# Patient Record
Sex: Female | Born: 1990
Health system: Southern US, Community
[De-identification: ages and names within clinical notes are randomized; demographics above are authoritative.]

## PROBLEM LIST (undated history)

## (undated) ENCOUNTER — Ambulatory Visit: Discharge status: Absent without leave | Payer: Self-pay

## (undated) DIAGNOSIS — J45909 Unspecified asthma, uncomplicated: Secondary | ICD-10-CM

---

## 2013-06-12 ENCOUNTER — Encounter (HOSPITAL_COMMUNITY): Payer: Self-pay | Admitting: Emergency Medicine

## 2013-06-12 ENCOUNTER — Emergency Department (HOSPITAL_COMMUNITY)
Admission: EM | Admit: 2013-06-12 | Discharge: 2013-06-12 | Disposition: A | Discharge status: Home / Self Care | Payer: BC Managed Care – PPO | Source: Home / Self Care

## 2013-06-12 DIAGNOSIS — B9789 Other viral agents as the cause of diseases classified elsewhere: Secondary | ICD-10-CM

## 2013-06-12 DIAGNOSIS — B349 Viral infection, unspecified: Secondary | ICD-10-CM

## 2013-06-12 HISTORY — DX: Unspecified asthma, uncomplicated: J45.909

## 2013-06-12 MED ORDER — IBUPROFEN 800 MG PO TABS: 800.0000 mg | ORAL_TABLET | Freq: Three times a day (TID) | ORAL | Status: DC

## 2013-06-12 NOTE — ED Notes (Signed)
Initially had sore throat, but does not complain of sore throat now.  Reports head congestion, denies ear pain, unsure if she has run a fever, patient has had chills.

## 2013-06-12 NOTE — ED Provider Notes (Signed)
CSN: [id number]     Arrival date & time 06/12/13  1141 History   None    Chief Complaint  Patient presents with  . URI   (Consider location/radiation/quality/duration/timing/severity/associated sxs/prior Treatment) Patient is a 23 y.o. female presenting with URI. The history is provided by the patient. No language interpreter was used.  URI Presenting symptoms: congestion and sore throat   Severity:  Moderate Onset quality:  Gradual Duration:  5 days Timing:  Constant Progression:  Worsening Chronicity:  New Relieved by:  Nothing Worsened by:  Nothing tried Associated symptoms: headaches     Past Medical History  Diagnosis Date  . Asthma    History reviewed. No pertinent past surgical history. No family history on file. History  Substance Use Topics  . Smoking status: Never Smoker   . Smokeless tobacco: Not on file  . Alcohol Use: No   OB History   Grav Para Term Preterm Abortions TAB SAB Ect Mult Living                 Review of Systems  HENT: Positive for congestion and sore throat.   Neurological: Positive for headaches.  All other systems reviewed and are negative.    Allergies  Review of patient's allergies indicates no known allergies.  Home Medications  No current outpatient prescriptions on file. BP 122/77  Pulse 74  Temp(Src) 98.4 F (36.9 C) (Oral)  Resp 16  SpO2 100%  LMP 06/04/2013 Physical Exam  Constitutional: She appears well-developed and well-nourished.  HENT:  Right Ear: External ear normal.  Left Ear: External ear normal.  Nose: Nose normal.  Mouth/Throat: Oropharynx is clear and moist.  Eyes: Conjunctivae and EOM are normal. Pupils are equal, round, and reactive to light.  Neck: Normal range of motion. Neck supple.  Cardiovascular: Normal rate and normal heart sounds.   Pulmonary/Chest: Effort normal and breath sounds normal.  Abdominal: Soft.  Musculoskeletal: Normal range of motion.  Neurological: She is alert.  Skin: Skin  is warm.  Psychiatric: She has a normal mood and affect.    ED Course  Procedures (including critical care time) Labs Review Labs Reviewed - No data to display Imaging Review No results found.  EKG Interpretation    Date/Time:    Ventricular Rate:    PR Interval:    QRS Duration:   QT Interval:    QTC Calculation:   R Axis:     Text Interpretation:              MDM   1. Viral illness    Ibuprofen     Elson AreasLeslie K Sofia, PA-C 06/12/13 1412

## 2013-06-12 NOTE — Discharge Instructions (Signed)
Viral Infections °A virus is a type of germ. Viruses can cause: °· Minor sore throats. °· Aches and pains. °· Headaches. °· Runny nose. °· Rashes. °· Watery eyes. °· Tiredness. °· Coughs. °· Loss of appetite. °· Feeling sick to your stomach (nausea). °· Throwing up (vomiting). °· Watery poop (diarrhea). °HOME CARE  °· Only take medicines as told by your doctor. °· Drink enough water and fluids to keep your pee (urine) clear or pale yellow. Sports drinks are a good choice. °· Get plenty of rest and eat healthy. Soups and broths with crackers or rice are fine. °GET HELP RIGHT AWAY IF:  °· You have a very bad headache. °· You have shortness of breath. °· You have chest pain or neck pain. °· You have an unusual rash. °· You cannot stop throwing up. °· You have watery poop that does not stop. °· You cannot keep fluids down. °· You or your child has a temperature by mouth above 102° F (38.9° C), not controlled by medicine. °· Your baby is older than 3 months with a rectal temperature of 102° F (38.9° C) or higher. °· Your baby is 3 months old or younger with a rectal temperature of 100.4° F (38° C) or higher. °MAKE SURE YOU:  °· Understand these instructions. °· Will watch this condition. °· Will get help right away if you are not doing well or get worse. °Document Released: 05/01/2008 Document Revised: 08/11/2011 Document Reviewed: 09/24/2010 °ExitCare® Patient Information ©2014 [employer]. ° °

## 2013-06-13 NOTE — ED Provider Notes (Signed)
Medical screening examination/treatment/procedure(s) were performed by resident physician or non-physician practitioner and as supervising physician I was immediately available for consultation/collaboration.   KINDL,JAMES DOUGLAS MD.   James D Kindl, MD 06/13/13 1755 

## 2013-08-22 ENCOUNTER — Encounter (HOSPITAL_COMMUNITY): Payer: Self-pay | Admitting: Emergency Medicine

## 2013-08-22 ENCOUNTER — Emergency Department (HOSPITAL_COMMUNITY)
Admission: EM | Admit: 2013-08-22 | Discharge: 2013-08-22 | Disposition: A | Discharge status: Home / Self Care | Payer: BC Managed Care – PPO | Source: Home / Self Care | Attending: Emergency Medicine | Admitting: Emergency Medicine

## 2013-08-22 ENCOUNTER — Emergency Department (INDEPENDENT_AMBULATORY_CARE_PROVIDER_SITE_OTHER): Payer: BC Managed Care – PPO

## 2013-08-22 DIAGNOSIS — S61209A Unspecified open wound of unspecified finger without damage to nail, initial encounter: Secondary | ICD-10-CM

## 2013-08-22 DIAGNOSIS — W268XXA Contact with other sharp object(s), not elsewhere classified, initial encounter: Secondary | ICD-10-CM

## 2013-08-22 DIAGNOSIS — S61012A Laceration without foreign body of left thumb without damage to nail, initial encounter: Secondary | ICD-10-CM

## 2013-08-22 DIAGNOSIS — S60459A Superficial foreign body of unspecified finger, initial encounter: Secondary | ICD-10-CM

## 2013-08-22 DIAGNOSIS — Z23 Encounter for immunization: Secondary | ICD-10-CM

## 2013-08-22 DIAGNOSIS — S60352A Superficial foreign body of left thumb, initial encounter: Secondary | ICD-10-CM

## 2013-08-22 MED ORDER — TETANUS-DIPHTH-ACELL PERTUSSIS 5-2.5-18.5 LF-MCG/0.5 IM SUSP
INTRAMUSCULAR | Status: AC
  Filled 2013-08-22: qty 0.5

## 2013-08-22 MED ORDER — TETANUS-DIPHTH-ACELL PERTUSSIS 5-2.5-18.5 LF-MCG/0.5 IM SUSP
0.5000 mL | Freq: Once | INTRAMUSCULAR | Status: AC
  Administered 2013-08-22: 0.5 mL via INTRAMUSCULAR

## 2013-08-22 MED ORDER — HYDROCODONE-ACETAMINOPHEN 5-325 MG PO TABS: ORAL_TABLET | ORAL | Status: DC

## 2013-08-22 NOTE — ED Provider Notes (Signed)
Chief Complaint   Chief Complaint  Patient presents with  . 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 The pipette broke. She has a small flap laceration the tip of the thumb. She's not sure whether any glass is present or not. Bleeding is controlled. She cannot recall when her last tetanus shot was.  Review of Systems   Other than as noted above, the patient denies any of the following symptoms: Musculoskeletal:  No joint pain or decreased range of motion. Neuro:  No numbness, tingling, or weakness.  PMFSH   Past medical history, family history, social history, meds, and allergies were reviewed.   Physical Examination     Vital signs:  BP 144/90  Pulse 82  Temp(Src) 98.5 F (36.9 C) (Oral)  Resp 16  SpO2 100%  LMP 08/20/2013 Ext:  There is a 5 mm flap laceration the tip of the thumb. Bleeding is well controlled.  All other joints had a full ROM without pain.  Pulses were full.  Good capillary refill in all digits.  No edema. Neurological:  Alert and oriented.  No muscle weakness.  Sensation was intact to light touch.   Radiology   Dg Finger Thumb Left  08/22/2013   CLINICAL DATA:  Glass injury to thumb.  EXAM: LEFT THUMB 2+V  COMPARISON:  None.  FINDINGS: Two linear foreign bodies within the soft tissues of the tip of the thumb identified.  No fracture, subluxation or dislocation.  IMPRESSION: Two small linear foreign bodies within the tip of the thumb.   Electronically Signed   By: Laveda Abbe M.D.   On: 08/22/2013 20:04     I reviewed the images independently and personally and concur with the radiologist's findings.  Procedure   Verbal informed consent was obtained.  The patient was informed of the risks and benefits of the procedure and understands and accepts.  A time out was called and the identity of the  patient and correct procedure were confirmed.   The laceration area described above was prepped with Betadine and saline  and anesthetized with a digital block with 5 mL of 2% Xylocaine without epinephrine.  The flap laceration was opened up and the 2 glass shards that were seen on x-ray were found after exploration and removed. There was considerable bleeding after this, so a suture closure of the wound was done. The wound was then closed as follows:  The edges were approximated with 2 5-0 nylon sutures. One suture was placed through the fingernail.  There were no immediate complications, and the patient tolerated the procedure well. The laceration was then cleansed, Bacitracin ointment was applied and a clean, dry pressure dressing was put on.   Course in Urgent [facility] Center   Given a Tdap vaccine.  Assessment   The primary encounter diagnosis was Laceration of left thumb. A diagnosis of Foreign body of thumb, left was also pertinent to this visit.  Plan   1.  Meds:  The following meds were prescribed:   Discharge Medication List as of 08/22/2013  8:55 PM    START taking these medications   Details  HYDROcodone-acetaminophen (NORCO/VICODIN) 5-325 MG per tablet 1 to 2 tabs every 4 to 6 hours as needed for pain., Print        2.  Patient Education/Counseling:  The  patient was given appropriate handouts, self care instructions, and instructed in symptomatic relief. Instructions were given for wound care.    3.  Follow up:  The patient was told to follow up immediately if there is any sign of infection.The patient will return in 14 days for suture removal.      Reuben Likesavid C Corleone Biegler, MD 08/22/13 2221

## 2013-08-22 NOTE — Discharge Instructions (Signed)

## 2013-08-22 NOTE — ED Notes (Signed)
Pipette broke in chemistry lab and cut her L thumb @ 1615.  Bleeding is stopped.  Good sensation and movement of thumb.  Cut is on the tip of the finger.

## 2013-09-06 ENCOUNTER — Emergency Department (INDEPENDENT_AMBULATORY_CARE_PROVIDER_SITE_OTHER)
Admission: EM | Admit: 2013-09-06 | Discharge: 2013-09-06 | Disposition: A | Discharge status: Home / Self Care | Payer: BC Managed Care – PPO | Source: Home / Self Care | Attending: Emergency Medicine | Admitting: Emergency Medicine

## 2013-09-06 ENCOUNTER — Encounter (HOSPITAL_COMMUNITY): Payer: Self-pay | Admitting: Emergency Medicine

## 2013-09-06 DIAGNOSIS — Z4802 Encounter for removal of sutures: Secondary | ICD-10-CM

## 2013-09-06 NOTE — ED Notes (Signed)
Suture removal

## 2013-09-06 NOTE — Discharge Instructions (Signed)
Suture Removal, Care After Refer to this sheet in the next few weeks. These instructions provide you with information on caring for yourself after your procedure. Your health care provider may also give you more specific instructions. Your treatment has been planned according to current medical practices, but problems sometimes occur. Call your health care provider if you have any problems or questions after your procedure. WHAT TO EXPECT AFTER THE PROCEDURE After your stitches (sutures) are removed, it is typical to have the following:  Some discomfort and swelling in the wound area.  Slight redness in the area. HOME CARE INSTRUCTIONS   If you have skin adhesive strips over the wound area, do not take the strips off. They will fall off on their own in a few days. If the strips remain in place after 14 days, you may remove them.  Change any bandages (dressings) at least once a day or as directed by your health care provider. If the bandage sticks, soak it off with warm, soapy water.  Apply cream or ointment only as directed by your health care provider. If using cream or ointment, wash the area with soap and water 2 times a day to remove all the cream or ointment. Rinse off the soap and pat the area dry with a clean towel.  Keep the wound area dry and clean. If the bandage becomes wet or dirty, or if it develops a bad smell, change it as soon as possible.  Continue to protect the wound from injury.  Use sunscreen when out in the sun. New scars become sunburned easily. SEEK MEDICAL CARE IF:  You have increasing redness, swelling, or pain in the wound.  You see pus coming from the wound.  You have a fever.  You notice a bad smell coming from the wound or dressing.  Your wound breaks open (edges not staying together). Document Released: 02/11/2001 Document Revised: 03/09/2013 Document Reviewed: 12/29/2012 ExitCare Patient Information 2014 [employer].  

## 2013-09-06 NOTE — ED Provider Notes (Signed)
CSN: [id number]     Arrival date & time 09/06/13  1759 History   First MD Initiated Contact with Patient 09/06/13 1923     Chief Complaint  Patient presents with  . Suture / Staple Removal   (Consider location/radiation/quality/duration/timing/severity/associated sxs/prior Treatment) Patient is a 23 y.o. female presenting with suture removal. The history is provided by the patient. No language interpreter was used.  Suture / Staple Removal This is a new problem. The current episode started more than 1 week ago. The problem occurs constantly. The problem has not changed since onset.Nothing aggravates the symptoms. Nothing relieves the symptoms. She has tried nothing for the symptoms. The treatment provided no relief.  Pt here for suture removal  Past Medical History  Diagnosis Date  . Asthma    History reviewed. No pertinent past surgical history. No family history on file. History  Substance Use Topics  . Smoking status: Never Smoker   . Smokeless tobacco: Not on file  . Alcohol Use: No   OB History   Grav Para Term Preterm Abortions TAB SAB Ect Mult Living                 Review of Systems  All other systems reviewed and are negative.    Allergies  Review of patient's allergies indicates no known allergies.  Home Medications   Current Outpatient Rx  Name  Route  Sig  Dispense  Refill  . HYDROcodone-acetaminophen (NORCO/VICODIN) 5-325 MG per tablet      1 to 2 tabs every 4 to 6 hours as needed for pain.   20 tablet   0   . ibuprofen (ADVIL,MOTRIN) 800 MG tablet   Oral   Take 1 tablet (800 mg total) by mouth 3 (three) times daily.   21 tablet   0    BP 149/88  Pulse 81  Temp(Src) 98.6 F (37 C) (Oral)  Resp 18  SpO2 97%  LMP 08/20/2013 Physical Exam  Constitutional: She is oriented to person, place, and time. She appears well-developed and well-nourished.  Musculoskeletal:  Healed wound end of finger  Neurological: She is alert and oriented to person,  place, and time. She has normal reflexes.  Skin: Skin is warm.  Psychiatric: She has a normal mood and affect.    ED Course  Procedures (including critical care time) Labs Review Labs Reviewed - No data to display Imaging Review No results found.   MDM   1. Visit for suture removal       Elson AreasLeslie K Sofia, PA-C 09/06/13 1944

## 2013-09-07 NOTE — ED Provider Notes (Signed)
Medical screening examination/treatment/procedure(s) were performed by non-physician practitioner and as supervising physician I was immediately available for consultation/collaboration.  Leslee Homeavid Jedrick Hutcherson, M.D.  Reuben Likesavid C Cathleen Yagi, MD 09/07/13 [phone]

## 2014-06-02 HISTORY — PX: WISDOM TOOTH EXTRACTION: SHX21

## 2015-08-14 ENCOUNTER — Ambulatory Visit (INDEPENDENT_AMBULATORY_CARE_PROVIDER_SITE_OTHER): Payer: BLUE CROSS/BLUE SHIELD | Admitting: Physician Assistant

## 2015-08-14 VITALS — BP 112/70 | HR 86 | Temp 98.5°F | Resp 18 | Ht 67.0 in | Wt 253.4 lb

## 2015-08-14 DIAGNOSIS — R070 Pain in throat: Secondary | ICD-10-CM

## 2015-08-14 DIAGNOSIS — B349 Viral infection, unspecified: Secondary | ICD-10-CM

## 2015-08-14 LAB — POCT RAPID STREP A (OFFICE): Rapid Strep A Screen: NEGATIVE

## 2015-08-14 NOTE — Patient Instructions (Addendum)
Please hydrate with over 64 oz of water per day. Gargle with salt water.   You can use cepacol lozenges for your throat pain.  Please use mucinex 1200mg  every 12 hours.  Pharyngitis Pharyngitis is redness, pain, and swelling (inflammation) of your pharynx.  CAUSES  Pharyngitis is usually caused by infection. Most of the time, these infections are from viruses (viral) and are part of a cold. However, sometimes pharyngitis is caused by bacteria (bacterial). Pharyngitis can also be caused by allergies. Viral pharyngitis may be spread from person to person by coughing, sneezing, and personal items or utensils (cups, forks, spoons, toothbrushes). Bacterial pharyngitis may be spread from person to person by more intimate contact, such as kissing.  SIGNS AND SYMPTOMS  Symptoms of pharyngitis include:   Sore throat.   Tiredness (fatigue).   Low-grade fever.   Headache.  Joint pain and muscle aches.  Skin rashes.  Swollen lymph nodes.  Plaque-like film on throat or tonsils (often seen with bacterial pharyngitis). DIAGNOSIS  Your health care provider will ask you questions about your illness and your symptoms. Your medical history, along with a physical exam, is often all that is needed to diagnose pharyngitis. Sometimes, a rapid strep test is done. Other lab tests may also be done, depending on the suspected cause.  TREATMENT  Viral pharyngitis will usually get better in 3-4 days without the use of medicine. Bacterial pharyngitis is treated with medicines that kill germs (antibiotics).  HOME CARE INSTRUCTIONS   Drink enough water and fluids to keep your urine clear or pale yellow.   Only take over-the-counter or prescription medicines as directed by your health care provider:   If you are prescribed antibiotics, make sure you finish them even if you start to feel better.   Do not take aspirin.   Get lots of rest.   Gargle with 8 oz of salt water ( tsp of salt per 1 qt of  water) as often as every 1-2 hours to soothe your throat.   Throat lozenges (if you are not at risk for choking) or sprays may be used to soothe your throat. SEEK MEDICAL CARE IF:   You have large, tender lumps in your neck.  You have a rash.  You cough up green, yellow-brown, or bloody spit. SEEK IMMEDIATE MEDICAL CARE IF:   Your neck becomes stiff.  You drool or are unable to swallow liquids.  You vomit or are unable to keep medicines or liquids down.  You have severe pain that does not go away with the use of recommended medicines.  You have trouble breathing (not caused by a stuffy nose). MAKE SURE YOU:   Understand these instructions.  Will watch your condition.  Will get help right away if you are not doing well or get worse.   This information is not intended to replace advice given to you by your health care provider. Make sure you discuss any questions you have with your health care provider.   Document Released: 05/19/2005 Document Revised: 03/09/2013 Document Reviewed: 01/24/2013 Elsevier Interactive Patient Education [employer]2016 Elsevier Inc.

## 2015-08-14 NOTE — Progress Notes (Signed)
[facility] [address] [place] [address] [phone]  Date:  08/14/2015   Name:  Diana Fowler   DOB:  [birth year]   MRN:  [id number]  PCP:  No PCP Per Patient    History of Present Illness:  Diana Fowler is a 25 y.o. female patient who presents to [facility] for sore throat.     Sore throat for 4 days.  Sore throat with severe body aches, fatigue.  Yesterday, the sore throat has worsened.  No hx of fever.  No abnormal coughing or nasal congestion.  Feels like something is in her throat.  There is otalgia when she yawns.  She has take ibuprofen which helps very little.  Tea has helped.    There are no active problems to display for this patient.   Past Medical History  Diagnosis Date  . Asthma     History reviewed. No pertinent past surgical history.  Social History  Substance Use Topics  . Smoking status: Never Smoker   . Smokeless tobacco: None  . Alcohol Use: No    History reviewed. No pertinent family history.  No Known Allergies  Medication list has been reviewed and updated.  Current Outpatient Prescriptions on File Prior to Visit  Medication Sig Dispense Refill  . ibuprofen (ADVIL,MOTRIN) 800 MG tablet Take 1 tablet (800 mg total) by mouth 3 (three) times daily. 21 tablet 0   No current facility-administered medications on file prior to visit.    ROS ROS otherwise unremarkable unless listed above.   Physical Examination: BP 112/70 mmHg  Pulse 86  Temp(Src) 98.5 F (36.9 C) (Oral)  Resp 18  Ht  (1.702 m)  Wt 253 lb 6.4 oz (114.941 kg)  BMI 39.68 kg/m2  SpO2 94%  LMP 07/20/2015 Ideal Body Weight: Weight in (lb) to have BMI = 25: 159.3  Physical Exam  Constitutional: She is oriented to person, place, and time. She appears well-developed and well-nourished. No distress.  HENT:  Head: Normocephalic and atraumatic.  Right Ear: Tympanic membrane, external ear and ear canal normal.  Left Ear: Tympanic membrane, external  ear and ear canal normal.  Nose: Mucosal edema and rhinorrhea present. Right sinus exhibits no maxillary sinus tenderness and no frontal sinus tenderness. Left sinus exhibits no maxillary sinus tenderness and no frontal sinus tenderness.  Mouth/Throat: No uvula swelling. No oropharyngeal exudate, posterior oropharyngeal edema or posterior oropharyngeal erythema.  Eyes: Conjunctivae and EOM are normal. Pupils are equal, round, and reactive to light.  Cardiovascular: Normal rate and regular rhythm.  Exam reveals no gallop, no distant heart sounds and no friction rub.   No murmur heard. Pulmonary/Chest: Effort normal. No respiratory distress. She has no decreased breath sounds. She has no wheezes. She has no rhonchi.  Lymphadenopathy:       Head (right side): No submandibular, no tonsillar, no preauricular and no posterior auricular adenopathy present.       Head (left side): No submandibular, no tonsillar, no preauricular and no posterior auricular adenopathy present.  Neurological: She is alert and oriented to person, place, and time.  Skin: She is not diaphoretic.  Psychiatric: She has a normal mood and affect. Her behavior is normal.    Results for orders placed or performed in visit on 08/14/15  POCT rapid strep A  Result Value Ref Range   Rapid Strep A Screen Negative Negative    Assessment and Plan: Diana Fowler is a 25 y.o. female who  is here today for sore throat. This apperas to be post nasal drip. Advised heavy hydration, mucinex, and cepacol lozenges.  Likely viral.  Viral pharyngitis  Throat pain - Plan: POCT rapid strep A, Culture, Group A Strep   Trena PlattStephanie Briahnna Harries, PA-C [facility] 08/14/2015 9:12 AM

## 2015-08-15 LAB — CULTURE, GROUP A STREP: ORGANISM ID, BACTERIA: NORMAL

## 2017-11-12 DIAGNOSIS — H5213 Myopia, bilateral: Secondary | ICD-10-CM | POA: Insufficient documentation

## 2020-06-16 ENCOUNTER — Ambulatory Visit: Admit: 2020-06-16 | Discharge status: Absent without leave | Payer: Self-pay

## 2020-06-16 ENCOUNTER — Ambulatory Visit
Admission: EM | Admit: 2020-06-16 | Discharge: 2020-06-16 | Disposition: A | Discharge status: Home / Self Care | Payer: HRSA Program | Attending: Family Medicine | Admitting: Family Medicine

## 2020-06-16 ENCOUNTER — Other Ambulatory Visit: Payer: Self-pay

## 2020-06-16 ENCOUNTER — Encounter: Payer: Self-pay | Admitting: Emergency Medicine

## 2020-06-16 DIAGNOSIS — U071 COVID-19: Secondary | ICD-10-CM | POA: Insufficient documentation

## 2020-06-16 DIAGNOSIS — R059 Cough, unspecified: Secondary | ICD-10-CM | POA: Diagnosis present

## 2020-06-16 DIAGNOSIS — J069 Acute upper respiratory infection, unspecified: Secondary | ICD-10-CM | POA: Diagnosis not present

## 2020-06-16 DIAGNOSIS — R197 Diarrhea, unspecified: Secondary | ICD-10-CM | POA: Insufficient documentation

## 2020-06-16 MED ORDER — AEROCHAMBER MV MISC: 2 refills | Status: AC

## 2020-06-16 MED ORDER — PROMETHAZINE-DM 6.25-15 MG/5ML PO SYRP: 5.0000 mL | ORAL_SOLUTION | Freq: Four times a day (QID) | ORAL | 0 refills | Status: DC | PRN

## 2020-06-16 MED ORDER — ALBUTEROL SULFATE HFA 108 (90 BASE) MCG/ACT IN AERS: 2.0000 | INHALATION_SPRAY | RESPIRATORY_TRACT | 0 refills | Status: AC | PRN

## 2020-06-16 MED ORDER — BENZONATATE 100 MG PO CAPS: 200.0000 mg | ORAL_CAPSULE | Freq: Three times a day (TID) | ORAL | 0 refills | Status: DC

## 2020-06-16 NOTE — ED Triage Notes (Signed)
Patient c/o fatigue, nasal congestion, cough and low grade fever that started on Wed.

## 2020-06-16 NOTE — Discharge Instructions (Addendum)
Isolate at home until the results of your COVID test are back.  If you are positive then you will need to quarantine for 5 days from the start of your symptoms.  After the 5 days you can break quarantine if your symptoms have improved, you have not had a fever for 24 hours, and you wear a mask around other folks for an additional 5 days.  Use over-the-counter Tylenol and ibuprofen as needed for fever and pain.  Use your albuterol inhaler with the spacer, 2 puffs every 4-6 hours, as needed for shortness of breath and wheezing.  Use the Tessalon Perles during the day as needed for cough and the promethazine DM cough syrup at bedtime as it will make you drowsy.  If you develop shortness of breath-especially at rest, you are unable to speak in full sentences, or is a late sign your lips are turning blue you need to go to the ER for evaluation.

## 2020-06-16 NOTE — ED Provider Notes (Signed)
[facility]    CSN: [id number] Arrival date & time: 06/16/20  6269      History   Chief Complaint Chief Complaint  Patient presents with  . Fatigue  . Cough    HPI Carl L Ricciardelli is a 30 y.o. female.   HPI   30 year old female here for evaluation of fever, fatigue, nasal congestion, and cough.  Patient reports that her symptoms started Wednesday of last week.  She reports her T-max was 100.3 and that was on Thursday, 2 days ago, her cough is intermittently productive, patient also complaining of left ear pressure, she has had some associated nausea but that is resolved, and she developed diarrhea yesterday.  Patient also complains of body aches and chills.  Patient denies runny nose, changes to her hearing, sore throat, shortness of breath or wheezing, or known sick contacts.  Her office mate did test positive for COVID three days ago but she has not shared in office with this person for over a week.  Past Medical History:  Diagnosis Date  . Asthma     There are no problems to display for this patient.   History reviewed. No pertinent surgical history.  OB History   No obstetric history on file.      Home Medications    Prior to Admission medications   Medication Sig Start Date End Date Taking? Authorizing Provider  albuterol (VENTOLIN HFA) 108 (90 Base) MCG/ACT inhaler Inhale 2 puffs into the lungs every 4 (four) hours as needed. 06/16/20  Yes Becky Augusta, NP  benzonatate (TESSALON) 100 MG capsule Take 2 capsules (200 mg total) by mouth every 8 (eight) hours. 06/16/20  Yes Becky Augusta, NP  promethazine-dextromethorphan (PROMETHAZINE-DM) 6.25-15 MG/5ML syrup Take 5 mLs by mouth 4 (four) times daily as needed. 06/16/20  Yes Becky Augusta, NP  Spacer/Aero-Holding Chambers (AEROCHAMBER MV) inhaler Use as instructed 06/16/20  Yes Becky Augusta, NP  ibuprofen (ADVIL,MOTRIN) 800 MG tablet Take 1 tablet (800 mg total) by mouth 3 (three) times daily. 06/12/13   Elson Areas, PA-C    Family History History reviewed. No pertinent family history.  Social History Social History   Tobacco Use  . Smoking status: Never Smoker  . Smokeless tobacco: Never Used  Vaping Use  . Vaping Use: Never used  Substance Use Topics  . Alcohol use: No  . Drug use: No     Allergies   Patient has no known allergies.   Review of Systems Review of Systems  Constitutional: Positive for chills, fatigue and fever. Negative for activity change and appetite change.  HENT: Positive for congestion and ear pain. Negative for rhinorrhea and sore throat.   Respiratory: Positive for cough. Negative for shortness of breath and wheezing.   Gastrointestinal: Positive for diarrhea and nausea. Negative for vomiting.  Musculoskeletal: Positive for arthralgias and myalgias.  Skin: Negative for rash.  Hematological: Negative.   Psychiatric/Behavioral: Negative.      Physical Exam Triage Vital Signs ED Triage Vitals  Enc Vitals Group     BP 06/16/20 1016 (!) 138/102     Pulse Rate 06/16/20 1016 88     Resp 06/16/20 1016 14     Temp 06/16/20 1016 98.3 F (36.8 C)     Temp Source 06/16/20 1016 Oral     SpO2 06/16/20 1016 98 %     Weight 06/16/20 1013 280 lb (127 kg)     Height 06/16/20 1013 5\' 6"  (1.676 m)  Head Circumference --      Peak Flow --      Pain Score 06/16/20 1013 0     Pain Loc --      Pain Edu? --      Excl. in GC? --    No data found.  Updated Vital Signs BP (!) 138/102 (BP Location: Left Arm)   Pulse 88   Temp 98.3 F (36.8 C) (Oral)   Resp 14   Ht 5\' 6"  (1.676 m)   Wt 280 lb (127 kg)   LMP 06/02/2020 (Approximate)   SpO2 98%   BMI 45.19 kg/m   Visual Acuity Right Eye Distance:   Left Eye Distance:   Bilateral Distance:    Right Eye Near:   Left Eye Near:    Bilateral Near:     Physical Exam Vitals and nursing note reviewed.  Constitutional:      General: She is not in acute distress.    Appearance: Normal appearance. She  is obese. She is not ill-appearing.  HENT:     Head: Normocephalic and atraumatic.     Right Ear: Tympanic membrane, ear canal and external ear normal.     Left Ear: Tympanic membrane, ear canal and external ear normal.     Nose: Congestion and rhinorrhea present.     Comments: Nasal mucosa is erythematous and edematous with scant clear nasal discharge.    Mouth/Throat:     Mouth: Mucous membranes are moist.     Pharynx: Oropharynx is clear. No oropharyngeal exudate or posterior oropharyngeal erythema.  Cardiovascular:     Rate and Rhythm: Normal rate and regular rhythm.     Pulses: Normal pulses.     Heart sounds: Normal heart sounds. No murmur heard. No gallop.   Pulmonary:     Effort: Pulmonary effort is normal.     Breath sounds: Normal breath sounds. No wheezing, rhonchi or rales.  Musculoskeletal:     Cervical back: Normal range of motion and neck supple.  Lymphadenopathy:     Cervical: No cervical adenopathy.  Skin:    General: Skin is warm and dry.     Capillary Refill: Capillary refill takes less than 2 seconds.     Findings: No erythema or rash.  Neurological:     General: No focal deficit present.     Mental Status: She is alert and oriented to person, place, and time.  Psychiatric:        Mood and Affect: Mood normal.        Behavior: Behavior normal.        Thought Content: Thought content normal.        Judgment: Judgment normal.      UC Treatments / Results  Labs (all labs ordered are listed, but only abnormal results are displayed) Labs Reviewed  SARS CORONAVIRUS 2 (TAT 6-24 HRS)    EKG   Radiology No results found.  Procedures Procedures (including critical care time)  Medications Ordered in UC Medications - No data to display  Initial Impression / Assessment and Plan / UC Course  I have reviewed the triage vital signs and the nursing notes.  Pertinent labs & imaging results that were available during my care of the patient were reviewed by  me and considered in my medical decision making (see chart for details).   Pleasant, nontoxic-appearing 30 year old female who presents for evaluation of cold symptoms that been going on for 3 days.  Patient has been vaccinated as COVID but  has not received her booster shot and she has not received her flu vaccine this year.  Physical exam does reveal some upper respiratory inflammation and edema in her nasal mucosa, no cervical lymphadenopathy is appreciated on exam, lungs clear to auscultation all fields.  Patient states she has a history of asthma that has been well controlled and she has not needed an inhaler so she does not have 1.  Patient is concerned that if this is COVID it could activate her asthma.  Will swab patient for COVID and discharged home to isolate pending the results.  Will give patient a prescription for an albuterol inhaler with spacer, Tessalon Perles, and Promethazine DM for cough and congestion.  Have discussed ER precautions with patient and she verbalizes an understanding.   Final Clinical Impressions(s) / UC Diagnoses   Final diagnoses:  Viral URI with cough     Discharge Instructions     Isolate at home until the results of your COVID test are back.  If you are positive then you will need to quarantine for 5 days from the start of your symptoms.  After the 5 days you can break quarantine if your symptoms have improved, you have not had a fever for 24 hours, and you wear a mask around other folks for an additional 5 days.  Use over-the-counter Tylenol and ibuprofen as needed for fever and pain.  Use your albuterol inhaler with the spacer, 2 puffs every 4-6 hours, as needed for shortness of breath and wheezing.  Use the Tessalon Perles during the day as needed for cough and the promethazine DM cough syrup at bedtime as it will make you drowsy.  If you develop shortness of breath-especially at rest, you are unable to speak in full sentences, or is a late sign your  lips are turning blue you need to go to the ER for evaluation.    ED Prescriptions    Medication Sig Dispense Auth. Provider   albuterol (VENTOLIN HFA) 108 (90 Base) MCG/ACT inhaler Inhale 2 puffs into the lungs every 4 (four) hours as needed. 18 g Becky Augusta, NP   Spacer/Aero-Holding Chambers (AEROCHAMBER MV) inhaler Use as instructed 1 each Becky Augusta, NP   benzonatate (TESSALON) 100 MG capsule Take 2 capsules (200 mg total) by mouth every 8 (eight) hours. 21 capsule Becky Augusta, NP   promethazine-dextromethorphan (PROMETHAZINE-DM) 6.25-15 MG/5ML syrup Take 5 mLs by mouth 4 (four) times daily as needed. 118 mL Becky Augusta, NP     PDMP not reviewed this encounter.   Becky Augusta, NP 06/16/20 1057

## 2020-06-17 LAB — SARS CORONAVIRUS 2 (TAT 6-24 HRS): SARS Coronavirus 2: POSITIVE — AB

## 2021-04-11 ENCOUNTER — Other Ambulatory Visit: Payer: Self-pay

## 2021-04-11 ENCOUNTER — Ambulatory Visit
Admission: EM | Admit: 2021-04-11 | Discharge: 2021-04-11 | Disposition: A | Discharge status: Home / Self Care | Payer: BC Managed Care – PPO | Attending: Emergency Medicine | Admitting: Emergency Medicine

## 2021-04-11 DIAGNOSIS — J0100 Acute maxillary sinusitis, unspecified: Secondary | ICD-10-CM | POA: Diagnosis not present

## 2021-04-11 MED ORDER — CETIRIZINE HCL 10 MG PO TABS: 10.0000 mg | ORAL_TABLET | Freq: Every day | ORAL | 0 refills | Status: AC

## 2021-04-11 MED ORDER — AMOXICILLIN-POT CLAVULANATE 875-125 MG PO TABS: 1.0000 | ORAL_TABLET | Freq: Two times a day (BID) | ORAL | 0 refills | Status: DC

## 2021-04-11 MED ORDER — FLUTICASONE PROPIONATE 50 MCG/ACT NA SUSP: 1.0000 | Freq: Every day | NASAL | 0 refills | Status: AC

## 2021-04-11 NOTE — ED Provider Notes (Addendum)
[facility]    CSN: [id number] Arrival date & time: 04/11/21  0802      History   Chief Complaint Chief Complaint  Patient presents with   Chest Congestion    HPI Diana Fowler is a 30 y.o. female.   Patient presents with bilateral ear pain and fullness, predominantly on the right side, nasal congestion, rhinorrhea, facial pain for 5 days.  Has been tested for COVID and flu both negative.  Has attempted use of Mucinex with no relief.  History of asthma.  Denies fever, chills, body aches, sore throat, cough, shortness of breath, wheezing, abdominal pain, nausea, vomiting, diarrhea.    Past Medical History:  Diagnosis Date   Asthma     There are no problems to display for this patient.   History reviewed. No pertinent surgical history.  OB History   No obstetric history on file.      Home Medications    Prior to Admission medications   Medication Sig Start Date End Date Taking? Authorizing Provider  albuterol (VENTOLIN HFA) 108 (90 Base) MCG/ACT inhaler Inhale 2 puffs into the lungs every 4 (four) hours as needed. 06/16/20  Yes Becky Augusta, NP  Spacer/Aero-Holding Chambers (AEROCHAMBER MV) inhaler Use as instructed 06/16/20  Yes Becky Augusta, NP  benzonatate (TESSALON) 100 MG capsule Take 2 capsules (200 mg total) by mouth every 8 (eight) hours. 06/16/20   Becky Augusta, NP  ibuprofen (ADVIL,MOTRIN) 800 MG tablet Take 1 tablet (800 mg total) by mouth 3 (three) times daily. 06/12/13   Elson Areas, PA-C  promethazine-dextromethorphan (PROMETHAZINE-DM) 6.25-15 MG/5ML syrup Take 5 mLs by mouth 4 (four) times daily as needed. 06/16/20   Becky Augusta, NP    Family History History reviewed. No pertinent family history.  Social History Social History   Tobacco Use   Smoking status: Never   Smokeless tobacco: Never  Vaping Use   Vaping Use: Never used  Substance Use Topics   Alcohol use: No   Drug use: No     Allergies   Patient has no known  allergies.   Review of Systems Review of Systems  Constitutional: Negative.   HENT:  Positive for congestion, ear pain, postnasal drip, rhinorrhea and sinus pressure. Negative for dental problem, drooling, ear discharge, facial swelling, hearing loss, mouth sores, nosebleeds, sinus pain, sneezing, sore throat, tinnitus, trouble swallowing and voice change.   Respiratory:  Positive for shortness of breath. Negative for apnea, cough, choking, chest tightness, wheezing and stridor.   Cardiovascular: Negative.   Gastrointestinal: Negative.   Skin: Negative.   Neurological: Negative.     Physical Exam Triage Vital Signs ED Triage Vitals  Enc Vitals Group     BP 04/11/21 0828 128/82     Pulse Rate 04/11/21 0826 93     Resp 04/11/21 0826 20     Temp 04/11/21 0826 99 F (37.2 C)     Temp Source 04/11/21 0826 Oral     SpO2 04/11/21 0826 99 %     Weight 04/11/21 0824 295 lb (133.8 kg)     Height 04/11/21 0824 5\' 6"  (1.676 m)     Head Circumference --      Peak Flow --      Pain Score 04/11/21 0823 0     Pain Loc --      Pain Edu? --      Excl. in GC? --    No data found.  Updated Vital Signs BP 128/82  Pulse 93   Temp 99 F (37.2 C) (Oral)   Resp 20   Ht 5\' 6"  (1.676 m)   Wt 295 lb (133.8 kg)   LMP 03/01/2021   SpO2 99%   BMI 47.61 kg/m   Visual Acuity Right Eye Distance:   Left Eye Distance:   Bilateral Distance:    Right Eye Near:   Left Eye Near:    Bilateral Near:     Physical Exam Constitutional:      Appearance: Normal appearance. She is normal weight.  HENT:     Right Ear: Ear canal and external ear normal. There is impacted cerumen.     Left Ear: Tympanic membrane, ear canal and external ear normal.     Nose: Congestion and rhinorrhea present.     Right Turbinates: Enlarged.     Left Turbinates: Enlarged.     Right Sinus: Maxillary sinus tenderness present. No frontal sinus tenderness.     Left Sinus: Maxillary sinus tenderness present. No frontal  sinus tenderness.     Mouth/Throat:     Mouth: Mucous membranes are moist.     Pharynx: Oropharynx is clear.  Eyes:     Extraocular Movements: Extraocular movements intact.  Cardiovascular:     Rate and Rhythm: Normal rate and regular rhythm.     Pulses: Normal pulses.     Heart sounds: Normal heart sounds.  Pulmonary:     Effort: Pulmonary effort is normal.     Breath sounds: Normal breath sounds.  Musculoskeletal:     Cervical back: Normal range of motion and neck supple.  Skin:    General: Skin is warm and dry.  Neurological:     Mental Status: She is alert and oriented to person, place, and time. Mental status is at baseline.  Psychiatric:        Mood and Affect: Mood normal.        Behavior: Behavior normal.     UC Treatments / Results  Labs (all labs ordered are listed, but only abnormal results are displayed) Labs Reviewed - No data to display  EKG   Radiology No results found.  Procedures Procedures (including critical care time)  Medications Ordered in UC Medications - No data to display  Initial Impression / Assessment and Plan / UC Course  I have reviewed the triage vital signs and the nursing notes.  Pertinent labs & imaging results that were available during my care of the patient were reviewed by me and considered in my medical decision making (see chart for details).   Acute nonrecurrent maxillary sinusitis  Discussed etiology of symptoms, timeline and possible resolution, will prescribe watchful wait antibiotic to be started on day 10 of illness, patient in agreement with plan of care  1.  Flonase 50 mcg 1 spray each nostril daily 2.  Zyrtec 10 mg daily at bedtime 3.  Continue use of Mucinex and other over-the-counter medications if helpful 4.  Augmentin 875-125 twice daily for 7 days beginning November 15 which is day 10 of illness 5.  Urgent care follow-up as needed 6.  Work note given 7.  Right ear irrigation completed by nursing  staff Final Clinical Impressions(s) / UC Diagnoses   Final diagnoses:  None   Discharge Instructions   None    ED Prescriptions   None    PDMP not reviewed this encounter.   November 17, NP 04/11/21 0901    13/10/22, NP 04/11/21 [phone]

## 2021-04-11 NOTE — Discharge Instructions (Addendum)
Today you are being treated for a sinus infection, these are typically viral and will be given a chance for it to work after system before attempting use of antibiotics  Therefore if you are still having symptoms on day 10 of illness you may go to the pharmacy to get Augmentin for use, take Augmentin twice a day for the next 7 days, this medication will be available to you on Monday  Continue use of Mucinex to help with congestion  In addition I have added Flonase for you to use every morning and antihistamine for you to use before bed to further help reduce secretions

## 2021-04-11 NOTE — ED Triage Notes (Signed)
Pt here with C/O chest congestion since Sunday, Has had Covid, and Flu test which were negative. Has had a couple nose bleeds with blowing nose. Pressure in head and face.

## 2021-10-09 ENCOUNTER — Ambulatory Visit
Admission: RE | Admit: 2021-10-09 | Discharge: 2021-10-09 | Disposition: A | Discharge status: Home / Self Care | Payer: BC Managed Care – PPO | Source: Ambulatory Visit | Attending: Emergency Medicine | Admitting: Emergency Medicine

## 2021-10-09 VITALS — BP 130/91 | HR 107 | Temp 99.2°F | Resp 22 | Ht 66.0 in | Wt 310.0 lb

## 2021-10-09 DIAGNOSIS — J029 Acute pharyngitis, unspecified: Secondary | ICD-10-CM

## 2021-10-09 DIAGNOSIS — R0982 Postnasal drip: Secondary | ICD-10-CM

## 2021-10-09 LAB — GROUP A STREP BY PCR: Group A Strep by PCR: NOT DETECTED

## 2021-10-09 MED ORDER — IPRATROPIUM BROMIDE 0.06 % NA SOLN: 2.0000 | Freq: Four times a day (QID) | NASAL | 12 refills | Status: AC

## 2021-10-09 NOTE — ED Provider Notes (Signed)
?[facility] ? ? ? ?CSN: [id number] ?Arrival date & time: 10/09/21  0808 ? ? ?  ? ?History   ?Chief Complaint ?Chief Complaint  ?Patient presents with  ? Sore Throat  ?  And chills - Entered by patient  ? ? ?HPI ?Diana Fowler is a 31 y.o. female.  ? ?HPI ? ?31 year old female here for evaluation of sore throat. ? ?Patient reports that her sore throat started 2 days ago and she reports significant discomfort and swallowing.  She endorses some chills last night and a feeling of fatigue.  She has not measured a fever at home and she denies runny nose, nasal congestion, or cough.  She does endorse some slight PND and some pain in her left ear. ? ?Past Medical History:  ?Diagnosis Date  ? Asthma   ? ? ?Patient Active Problem List  ? Diagnosis Date Noted  ? Myopia of both eyes 11/12/2017  ? ? ?History reviewed. No pertinent surgical history. ? ?OB History   ?No obstetric history on file. ?  ? ? ? ?Home Medications   ? ?Prior to Admission medications   ?Medication Sig Start Date End Date Taking? Authorizing Provider  ?ipratropium (ATROVENT) 0.06 % nasal spray Place 2 sprays into both nostrils 4 (four) times daily. 10/09/21  Yes Becky Augustayan, Lilyannah Zuelke, NP  ?albuterol (VENTOLIN HFA) 108 (90 Base) MCG/ACT inhaler Inhale 2 puffs into the lungs every 4 (four) hours as needed. 06/16/20   Becky Augustayan, Nazaria Riesen, NP  ?cetirizine (ZYRTEC ALLERGY) 10 MG tablet Take 1 tablet (10 mg total) by mouth daily. 04/11/21   Valinda HoarWhite, Adrienne R, NP  ?fluticasone (FLONASE) 50 MCG/ACT nasal spray Place 1 spray into both nostrils daily. 04/11/21   Valinda HoarWhite, Adrienne R, NP  ?Spacer/Aero-Holding Chambers (AEROCHAMBER MV) inhaler Use as instructed 06/16/20   Becky Augustayan, Keyetta Hollingworth, NP  ? ? ?Family History ?History reviewed. No pertinent family history. ? ?Social History ?Social History  ? ?Tobacco Use  ? Smoking status: Never  ? Smokeless tobacco: Never  ?Vaping Use  ? Vaping Use: Never used  ?Substance Use Topics  ? Alcohol use: No  ? Drug use: No  ? ? ? ?Allergies    ?Azithromycin ? ? ?Review of Systems ?Review of Systems  ?Constitutional:  Positive for chills and fatigue. Negative for fever.  ?HENT:  Positive for ear pain and sore throat. Negative for congestion and rhinorrhea.   ?Respiratory:  Negative for cough.   ?Hematological: Negative.   ?Psychiatric/Behavioral: Negative.    ? ? ?Physical Exam ?Triage Vital Signs ?ED Triage Vitals  ?Enc Vitals Group  ?   BP 10/09/21 0819 (!) 130/91  ?   Pulse Rate 10/09/21 0819 (!) 107  ?   Resp 10/09/21 0819 (!) 22  ?   Temp 10/09/21 0819 99.2 ?F (37.3 ?C)  ?   Temp Source 10/09/21 0819 Oral  ?   SpO2 10/09/21 0819 98 %  ?   Weight 10/09/21 0817 (!) 310 lb (140.6 kg)  ?   Height 10/09/21 0817 5\' 6"  (1.676 m)  ?   Head Circumference --   ?   Peak Flow --   ?   Pain Score 10/09/21 0814 6  ?   Pain Loc --   ?   Pain Edu? --   ?   Excl. in GC? --   ? ?No data found. ? ?Updated Vital Signs ?BP (!) 130/91 (BP Location: Left Arm)   Pulse (!) 107   Temp 99.2 ?F (37.3 ?C) (Oral)  Resp (!) 22   Ht 5\' 6"  (1.676 m)   Wt (!) 310 lb (140.6 kg)   LMP 09/09/2021 (Approximate)   SpO2 98%   BMI 50.04 kg/m?  ? ?Visual Acuity ?Right Eye Distance:   ?Left Eye Distance:   ?Bilateral Distance:   ? ?Right Eye Near:   ?Left Eye Near:    ?Bilateral Near:    ? ?Physical Exam ?Vitals and nursing note reviewed.  ?Constitutional:   ?   Appearance: Normal appearance. She is obese. She is not ill-appearing.  ?HENT:  ?   Head: Normocephalic and atraumatic.  ?   Right Ear: Tympanic membrane, ear canal and external ear normal. There is no impacted cerumen.  ?   Left Ear: Tympanic membrane, ear canal and external ear normal. There is no impacted cerumen.  ?   Nose: Congestion present. No rhinorrhea.  ?   Mouth/Throat:  ?   Mouth: Mucous membranes are moist.  ?   Pharynx: Oropharynx is clear. Posterior oropharyngeal erythema present. No oropharyngeal exudate.  ?Cardiovascular:  ?   Rate and Rhythm: Normal rate and regular rhythm.  ?   Pulses: Normal pulses.  ?    Heart sounds: Normal heart sounds. No murmur heard. ?  No friction rub. No gallop.  ?Pulmonary:  ?   Effort: Pulmonary effort is normal.  ?   Breath sounds: Normal breath sounds. No wheezing, rhonchi or rales.  ?Musculoskeletal:  ?   Cervical back: Normal range of motion and neck supple. No tenderness.  ?Lymphadenopathy:  ?   Cervical: No cervical adenopathy.  ?Skin: ?   General: Skin is warm and dry.  ?   Capillary Refill: Capillary refill takes less than 2 seconds.  ?   Findings: No erythema or rash.  ?Neurological:  ?   General: No focal deficit present.  ?   Mental Status: She is alert and oriented to person, place, and time.  ?Psychiatric:     ?   Mood and Affect: Mood normal.     ?   Behavior: Behavior normal.     ?   Thought Content: Thought content normal.     ?   Judgment: Judgment normal.  ? ? ? ?UC Treatments / Results  ?Labs ?(all labs ordered are listed, but only abnormal results are displayed) ?Labs Reviewed  ?GROUP A STREP BY PCR  ? ? ?EKG ? ? ?Radiology ?No results found. ? ?Procedures ?Procedures (including critical care time) ? ?Medications Ordered in UC ?Medications - No data to display ? ?Initial Impression / Assessment and Plan / UC Course  ?I have reviewed the triage vital signs and the nursing notes. ? ?Pertinent labs & imaging results that were available during my care of the patient were reviewed by me and considered in my medical decision making (see chart for details). ? ?Patient is a nontoxic-appearing 31 year old female here for evaluation of sore throat that is associated with chills and fatigue.  She also endorses some mild postnasal drip and left ear pain.  The left ear pain is not present at current.  She has not had a fever during her course of illness and she denies runny nose, nasal congestion, or cough.  On exam patient has pearly-gray tympanic membranes bilaterally with some mildly ceruminous external auditory canals.  Nasal mucosa is mildly erythematous and edematous without  any significant discharge noted.  Oropharyngeal exam reveals 2+ edematous tonsillar pillars without significant erythema.  No exudate present.  Posterior oropharynx is unremarkable.  No cervical lymphadenopathy appreciated exam and patient does not endorse any pain with palpation of her bilateral eustachian tubes externally.  Cardiopulmonary exam reveals S1-S2 heart sounds with regular rate and rhythm and lung sounds that are clear to auscultation all fields.  Strep PCR was collected at triage and is pending. ? ?Strep PCR is negative. ? ?We will discharge patient home with a diagnosis of pharyngitis.  This may be related to postnasal drip and nasal congestion.  I will prescribe her Atrovent nasal spray to help with both of those symptoms and have her perform salt water gargles and use over-the-counter antiseptic/anesthetic lozenges as needed. ? ? ?Final Clinical Impressions(s) / UC Diagnoses  ? ?Final diagnoses:  ?Pharyngitis, unspecified etiology  ?Postnasal drip  ? ? ? ?Discharge Instructions   ? ?  ?Testing today did not reveal the presence of strep and your symptoms may be related to the postnasal drip that she been experiencing. ? ?Use the Atrovent nasal spray, 2 squirts up each nostril every 6 hours, as needed for postnasal drip and nasal congestion. ? ?Gargle with warm salt water 2-3 times a day to soothe your throat, aid in pain relief, and aid in healing. ? ?Take over-the-counter ibuprofen according to the package instructions as needed for pain. ? ?You can also use Chloraseptic or Sucrets lozenges, 1 lozenge every 2 hours as needed for throat pain. ? ?If you develop any new or worsening symptoms return for reevaluation.  ? ? ? ? ?ED Prescriptions   ? ? Medication Sig Dispense Auth. Provider  ? ipratropium (ATROVENT) 0.06 % nasal spray Place 2 sprays into both nostrils 4 (four) times daily. 15 mL Becky Augusta, NP  ? ?  ? ?PDMP not reviewed this encounter. ?  ?Becky Augusta, NP ?10/09/21 [phone] ? ?

## 2021-10-09 NOTE — Discharge Instructions (Addendum)
Testing today did not reveal the presence of strep and your symptoms may be related to the postnasal drip that she been experiencing. ? ?Use the Atrovent nasal spray, 2 squirts up each nostril every 6 hours, as needed for postnasal drip and nasal congestion. ? ?Gargle with warm salt water 2-3 times a day to soothe your throat, aid in pain relief, and aid in healing. ? ?Take over-the-counter ibuprofen according to the package instructions as needed for pain. ? ?You can also use Chloraseptic or Sucrets lozenges, 1 lozenge every 2 hours as needed for throat pain. ? ?If you develop any new or worsening symptoms return for reevaluation.  ?

## 2021-10-09 NOTE — ED Triage Notes (Signed)
Patient is here for "Sore throat" that started Monday night, then yesterday really bad, chills at times, no fever known". No "runny nose". Some "fatigue". No new/unexplained rash.  ?

## 2021-11-10 ENCOUNTER — Ambulatory Visit: Payer: Self-pay

## 2022-02-19 ENCOUNTER — Ambulatory Visit
Admission: EM | Admit: 2022-02-19 | Discharge: 2022-02-19 | Disposition: A | Discharge status: Home / Self Care | Payer: BC Managed Care – PPO | Attending: Physician Assistant | Admitting: Physician Assistant

## 2022-02-19 DIAGNOSIS — Z9189 Other specified personal risk factors, not elsewhere classified: Secondary | ICD-10-CM | POA: Diagnosis present

## 2022-02-19 DIAGNOSIS — Z20822 Contact with and (suspected) exposure to covid-19: Secondary | ICD-10-CM

## 2022-02-19 DIAGNOSIS — U071 COVID-19: Secondary | ICD-10-CM | POA: Insufficient documentation

## 2022-02-19 LAB — SARS CORONAVIRUS 2 BY RT PCR: SARS Coronavirus 2 by RT PCR: POSITIVE — AB

## 2022-02-19 NOTE — ED Triage Notes (Signed)
Pt here for Covid test. Was exposed at work on Monday, was asked to come get a test. Works at [employer]. No SX

## 2022-02-19 NOTE — ED Provider Notes (Signed)
[facility]    CSN: [id number] Arrival date & time: 02/19/22  1259      History   Chief Complaint Chief Complaint  Patient presents with   Covid test    HPI Diana Fowler is a 31 y.o. female presenting for COVID testing.  Patient says that she was exposed to someone at work who tested positive for COVID.  She does not have any symptoms.  Denies fever, fatigue, cough, congestion, sore throat, body aches, nausea/vomiting or diarrhea.  She reports that her employer wanted her to be tested before returning to work.  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  HPI  Past Medical History:  Diagnosis Date   Asthma     Patient Active Problem List   Diagnosis Date Noted   Myopia of both eyes 11/12/2017    Past Surgical History:  Procedure Laterality Date   WISDOM TOOTH EXTRACTION  2016    OB History   No obstetric history on file.      Home Medications    Prior to Admission medications   Medication Sig Start Date End Date Taking? Authorizing Provider  albuterol (VENTOLIN HFA) 108 (90 Base) MCG/ACT inhaler Inhale 2 puffs into the lungs every 4 (four) hours as needed. 06/16/20   Becky Augusta, NP  cetirizine (ZYRTEC ALLERGY) 10 MG tablet Take 1 tablet (10 mg total) by mouth daily. 04/11/21   White, Elita Boone, NP  fluticasone (FLONASE) 50 MCG/ACT nasal spray Place 1 spray into both nostrils daily. 04/11/21   White, Elita Boone, NP  ipratropium (ATROVENT) 0.06 % nasal spray Place 2 sprays into both nostrils 4 (four) times daily. 10/09/21   Becky Augusta, NP  Spacer/Aero-Holding Deretha Emory (AEROCHAMBER MV) inhaler Use as instructed 06/16/20   Becky Augusta, NP    Family History No family history on file.  Social History Social History   Tobacco Use   Smoking status: Never   Smokeless tobacco: Never  Vaping Use   Vaping Use: Never used  Substance Use Topics   Alcohol use: No   Drug use: No     Allergies   Azithromycin   Review of Systems Review of Systems   Constitutional:  Negative for chills, diaphoresis, fatigue and fever.  HENT:  Negative for congestion, ear pain, rhinorrhea, sinus pressure, sinus pain and sore throat.   Respiratory:  Negative for cough and shortness of breath.   Gastrointestinal:  Negative for abdominal pain, nausea and vomiting.  Musculoskeletal:  Negative for arthralgias and myalgias.  Skin:  Negative for rash.  Neurological:  Negative for weakness and headaches.  Hematological:  Negative for adenopathy.     Physical Exam Triage Vital Signs ED Triage Vitals  Enc Vitals Group     BP 02/19/22 1316 120/85     Pulse Rate 02/19/22 1316 78     Resp 02/19/22 1316 18     Temp 02/19/22 1316 98.7 F (37.1 C)     Temp Source 02/19/22 1316 Oral     SpO2 02/19/22 1316 99 %     Weight 02/19/22 1314 (!) 320 lb (145.2 kg)     Height 02/19/22 1314 5\' 6"  (1.676 m)     Head Circumference --      Peak Flow --      Pain Score 02/19/22 1314 0     Pain Loc --      Pain Edu? --      Excl. in [place]? --    No data found.  Updated Vital Signs  BP 120/85 (BP Location: Left Arm)   Pulse 78   Temp 98.7 F (37.1 C) (Oral)   Resp 18   Ht 5\' 6"  (1.676 m)   Wt (!) 320 lb (145.2 kg)   LMP 02/18/2022   SpO2 99%   BMI 51.65 kg/m    Physical Exam Vitals and nursing note reviewed.  Constitutional:      General: She is not in acute distress.    Appearance: Normal appearance. She is not ill-appearing or toxic-appearing.  HENT:     Head: Normocephalic and atraumatic.     Nose: Nose normal.     Mouth/Throat:     Mouth: Mucous membranes are moist.     Pharynx: Oropharynx is clear.  Eyes:     General: No scleral icterus.       Right eye: No discharge.        Left eye: No discharge.     Conjunctiva/sclera: Conjunctivae normal.  Cardiovascular:     Rate and Rhythm: Normal rate and regular rhythm.     Heart sounds: Normal heart sounds.  Pulmonary:     Effort: Pulmonary effort is normal. No respiratory distress.     Breath  sounds: Normal breath sounds.  Musculoskeletal:     Cervical back: Neck supple.  Skin:    General: Skin is dry.  Neurological:     General: No focal deficit present.     Mental Status: She is alert. Mental status is at baseline.     Motor: No weakness.     Gait: Gait normal.  Psychiatric:        Mood and Affect: Mood normal.        Behavior: Behavior normal.        Thought Content: Thought content normal.      UC Treatments / Results  Labs (all labs ordered are listed, but only abnormal results are displayed) Labs Reviewed  SARS CORONAVIRUS 2 BY RT PCR    EKG   Radiology No results found.  Procedures Procedures (including critical care time)  Medications Ordered in UC Medications - No data to display  Initial Impression / Assessment and Plan / UC Course  I have reviewed the triage vital signs and the nursing notes.  Pertinent labs & imaging results that were available during my care of the patient were reviewed by me and considered in my medical decision making (see chart for details).   31 year old female presenting for COVID testing.  She has no symptoms.  Vitals are stable and she is overall well-appearing.  Exam is benign today.  PCR COVID is obtained.  Advised patient on access results.  Reviewed current CDC guidelines, isolation protocol and ED precautions of positive.  Advised if she is negative and if she were to develop symptoms later she should consider retesting.  Follow-up here as needed.   Final Clinical Impressions(s) / UC Diagnoses   Final diagnoses:  At increased risk of exposure to COVID-19 virus     Discharge Instructions      -Your test will be on MyChart today.  If it is positive we will call you.  You need to isolate 5 days from today and wear a mask for 5 days. - As we discussed, your initial test may be negative but if you become symptomatic you should test yourself again.  He would then need to isolate 5 days from the first day of  symptoms and wear a mask for 5 days.     ED  Prescriptions   None    PDMP not reviewed this encounter.   Danton Clap, PA-C 02/19/22 1344

## 2022-02-19 NOTE — Discharge Instructions (Signed)
-  Your test will be on MyChart today.  If it is positive we will call you.  You need to isolate 5 days from today and wear a mask for 5 days. - As we discussed, your initial test may be negative but if you become symptomatic you should test yourself again.  He would then need to isolate 5 days from the first day of symptoms and wear a mask for 5 days.

## 2022-02-20 ENCOUNTER — Ambulatory Visit: Payer: Self-pay

## 2022-04-17 ENCOUNTER — Ambulatory Visit
Admission: EM | Admit: 2022-04-17 | Discharge: 2022-04-17 | Disposition: A | Discharge status: Home / Self Care | Payer: BC Managed Care – PPO | Attending: Physician Assistant | Admitting: Physician Assistant

## 2022-04-17 DIAGNOSIS — J029 Acute pharyngitis, unspecified: Secondary | ICD-10-CM | POA: Insufficient documentation

## 2022-04-17 DIAGNOSIS — J069 Acute upper respiratory infection, unspecified: Secondary | ICD-10-CM | POA: Insufficient documentation

## 2022-04-17 LAB — RAPID INFLUENZA A&B ANTIGENS
Influenza A (ARMC): NEGATIVE
Influenza B (ARMC): NEGATIVE

## 2022-04-17 LAB — GROUP A STREP BY PCR: Group A Strep by PCR: NOT DETECTED

## 2022-04-17 NOTE — ED Triage Notes (Signed)
Patient reports that she has a sore throat since Monday. Patient reports that she has been having some chills.

## 2022-04-17 NOTE — Discharge Instructions (Signed)
URI/COLD SYMPTOMS: Your exam today is consistent with a viral illness. Antibiotics are not indicated at this time. Use medications as directed, including cough syrup, nasal saline, and decongestants. Your symptoms should improve over the next few days and resolve within 7-10 days. Increase rest and fluids. F/u if symptoms worsen or predominate such as sore throat, ear pain, productive cough, shortness of breath, or if you develop high fevers or worsening fatigue over the next several days.    -We will call if you are positive for the flu.

## 2022-04-17 NOTE — ED Provider Notes (Signed)
[facility]    CSN: [id number] Arrival date & time: 04/17/22  1252      History   Chief Complaint Chief Complaint  Patient presents with   Sore Throat   Chills    HPI Diana Fowler is a 31 y.o. female presenting for 3-day history of sore throat, chills, mild cough and slight congestion.  Denies fever.  No chest pain, breathing difficulty, abdominal pain, nausea/vomiting diarrhea.  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  She has a history of COVID-19 2 months ago.  Her medical history is significant for asthma.  Taking TheraFlu for symptoms.  No other complaints.  HPI  Past Medical History:  Diagnosis Date   Asthma     Patient Active Problem List   Diagnosis Date Noted   Myopia of both eyes 11/12/2017    Past Surgical History:  Procedure Laterality Date   WISDOM TOOTH EXTRACTION  2016    OB History   No obstetric history on file.      Home Medications    Prior to Admission medications   Medication Sig Start Date End Date Taking? Authorizing Provider  albuterol (VENTOLIN HFA) 108 (90 Base) MCG/ACT inhaler Inhale 2 puffs into the lungs every 4 (four) hours as needed. 06/16/20   Margarette Canada, NP  cetirizine (ZYRTEC ALLERGY) 10 MG tablet Take 1 tablet (10 mg total) by mouth daily. 04/11/21   White, Leitha Schuller, NP  fluticasone (FLONASE) 50 MCG/ACT nasal spray Place 1 spray into both nostrils daily. 04/11/21   White, Leitha Schuller, NP  ipratropium (ATROVENT) 0.06 % nasal spray Place 2 sprays into both nostrils 4 (four) times daily. 10/09/21   Margarette Canada, NP  Spacer/Aero-Holding Josiah Lobo (AEROCHAMBER MV) inhaler Use as instructed 06/16/20   Margarette Canada, NP    Family History History reviewed. No pertinent family history.  Social History Social History   Tobacco Use   Smoking status: Never   Smokeless tobacco: Never  Vaping Use   Vaping Use: Never used  Substance Use Topics   Alcohol use: No   Drug use: No      Allergies   Azithromycin   Review of Systems Review of Systems  Constitutional:  Positive for chills. Negative for diaphoresis, fatigue and fever.  HENT:  Positive for congestion and sore throat. Negative for ear pain, rhinorrhea, sinus pressure and sinus pain.   Respiratory:  Positive for cough. Negative for shortness of breath.   Gastrointestinal:  Negative for abdominal pain, nausea and vomiting.  Musculoskeletal:  Negative for arthralgias and myalgias.  Skin:  Negative for rash.  Neurological:  Negative for weakness and headaches.  Hematological:  Negative for adenopathy.     Physical Exam Triage Vital Signs ED Triage Vitals  Enc Vitals Group     BP      Pulse      Resp      Temp      Temp src      SpO2      Weight      Height      Head Circumference      Peak Flow      Pain Score      Pain Loc      Pain Edu?      Excl. in [place]?    No data found.  Updated Vital Signs BP (!) 146/106 (BP Location: Right Arm)   Pulse 95   Temp 98.7 F (37.1 C) (Oral)  Ht 5\' 6"  (1.676 m)   Wt (!) 310 lb (140.6 kg)   LMP 04/14/2022 (Approximate)   SpO2 98%   BMI 50.04 kg/m       Physical Exam Vitals and nursing note reviewed.  Constitutional:      General: She is not in acute distress.    Appearance: Normal appearance. She is well-developed. She is not ill-appearing or toxic-appearing.  HENT:     Head: Normocephalic and atraumatic.     Nose: Congestion present.     Mouth/Throat:     Mouth: Mucous membranes are moist.     Pharynx: Oropharynx is clear. Posterior oropharyngeal erythema present.  Eyes:     General: No scleral icterus.       Right eye: No discharge.        Left eye: No discharge.     Conjunctiva/sclera: Conjunctivae normal.  Cardiovascular:     Rate and Rhythm: Normal rate and regular rhythm.     Heart sounds: Normal heart sounds.  Pulmonary:     Effort: Pulmonary effort is normal. No respiratory distress.     Breath sounds: Normal breath  sounds.  Musculoskeletal:     Cervical back: Neck supple.  Skin:    General: Skin is dry.  Neurological:     General: No focal deficit present.     Mental Status: She is alert. Mental status is at baseline.     Motor: No weakness.     Gait: Gait normal.  Psychiatric:        Mood and Affect: Mood normal.        Behavior: Behavior normal.        Thought Content: Thought content normal.      UC Treatments / Results  Labs (all labs ordered are listed, but only abnormal results are displayed) Labs Reviewed  GROUP A STREP BY PCR  RAPID INFLUENZA A&B ANTIGENS    EKG   Radiology No results found.  Procedures Procedures (including critical care time)  Medications Ordered in UC Medications - No data to display  Initial Impression / Assessment and Plan / UC Course  I have reviewed the triage vital signs and the nursing notes.  Pertinent labs & imaging results that were available during my care of the patient were reviewed by me and considered in my medical decision making (see chart for details).   31 year old female presents for sore throat, fatigue, cough and congestion x3 days.  No associated fever.  History of COVID-19 2 months ago.  She is afebrile and overall well-appearing.  On exam she has nasal congestion and mild posterior pharyngeal erythema.  Chest clear auscultation heart regular rate and rhythm.  Rapid testing performed and negative.  Discussed results with patient. Rapid flu testing obtained.  Advised patient we will contact her if with any positive results.  Advised her that it would not really change the treatment plan at this time since she is out of the window for therapy with Tamiflu to help make a difference.  Advised to do supportive care at this time whether she has flu or another virus.  Advised continuing TheraFlu and using Chloraseptic spray, throat lozenges, ibuprofen, plenty rest and fluids.  Reviewed return precautions. Negative flu testing.  Final  Clinical Impressions(s) / UC Diagnoses   Final diagnoses:  Viral upper respiratory tract infection  Sore throat     Discharge Instructions      URI/COLD SYMPTOMS: Your exam today is consistent with a viral illness. Antibiotics are  not indicated at this time. Use medications as directed, including cough syrup, nasal saline, and decongestants. Your symptoms should improve over the next few days and resolve within 7-10 days. Increase rest and fluids. F/u if symptoms worsen or predominate such as sore throat, ear pain, productive cough, shortness of breath, or if you develop high fevers or worsening fatigue over the next several days.    -We will call if you are positive for the flu.     ED Prescriptions   None    PDMP not reviewed this encounter.   Shirlee Latch, PA-C 04/17/22 1455
